# Patient Record
Sex: Female | Born: 1995 | Race: White | Hispanic: No | Marital: Single | Smoking: Never smoker
Health system: Southern US, Community
[De-identification: ages and names within clinical notes are randomized; demographics above are authoritative.]

## PROBLEM LIST (undated history)

## (undated) DIAGNOSIS — R87619 Unspecified abnormal cytological findings in specimens from cervix uteri: Secondary | ICD-10-CM

## (undated) DIAGNOSIS — Z9889 Other specified postprocedural states: Secondary | ICD-10-CM

## (undated) DIAGNOSIS — R112 Nausea with vomiting, unspecified: Secondary | ICD-10-CM

## (undated) HISTORY — PX: HAND SURGERY: SHX662

---

## 2017-03-23 ENCOUNTER — Ambulatory Visit: Payer: BLUE CROSS/BLUE SHIELD | Admitting: Family

## 2017-03-23 DIAGNOSIS — Z0289 Encounter for other administrative examinations: Secondary | ICD-10-CM

## 2017-06-15 DIAGNOSIS — R87619 Unspecified abnormal cytological findings in specimens from cervix uteri: Secondary | ICD-10-CM

## 2017-06-15 HISTORY — DX: Unspecified abnormal cytological findings in specimens from cervix uteri: R87.619

## 2017-09-02 ENCOUNTER — Emergency Department: Payer: BLUE CROSS/BLUE SHIELD | Admitting: Anesthesiology

## 2017-09-02 ENCOUNTER — Encounter
Admission: EM | Disposition: A | Discharge status: Home / Self Care | Payer: Self-pay | Source: Home / Self Care | Attending: Emergency Medicine

## 2017-09-02 ENCOUNTER — Emergency Department: Payer: BLUE CROSS/BLUE SHIELD

## 2017-09-02 ENCOUNTER — Ambulatory Visit
Admission: EM | Admit: 2017-09-02 | Discharge: 2017-09-02 | Disposition: A | Discharge status: Home / Self Care | Payer: BLUE CROSS/BLUE SHIELD | Attending: Family Medicine | Admitting: Family Medicine

## 2017-09-02 ENCOUNTER — Encounter: Payer: Self-pay | Admitting: Emergency Medicine

## 2017-09-02 ENCOUNTER — Encounter: Payer: Self-pay | Admitting: Gynecology

## 2017-09-02 ENCOUNTER — Emergency Department
Admission: EM | Admit: 2017-09-02 | Discharge: 2017-09-02 | Disposition: A | Discharge status: Home / Self Care | Payer: BLUE CROSS/BLUE SHIELD | Attending: Emergency Medicine | Admitting: Emergency Medicine

## 2017-09-02 ENCOUNTER — Other Ambulatory Visit: Payer: Self-pay

## 2017-09-02 DIAGNOSIS — Z8249 Family history of ischemic heart disease and other diseases of the circulatory system: Secondary | ICD-10-CM | POA: Insufficient documentation

## 2017-09-02 DIAGNOSIS — R1031 Right lower quadrant pain: Secondary | ICD-10-CM

## 2017-09-02 DIAGNOSIS — Z793 Long term (current) use of hormonal contraceptives: Secondary | ICD-10-CM | POA: Diagnosis not present

## 2017-09-02 DIAGNOSIS — N83209 Unspecified ovarian cyst, unspecified side: Secondary | ICD-10-CM

## 2017-09-02 DIAGNOSIS — Z9889 Other specified postprocedural states: Secondary | ICD-10-CM | POA: Diagnosis not present

## 2017-09-02 DIAGNOSIS — N8311 Corpus luteum cyst of right ovary: Secondary | ICD-10-CM | POA: Diagnosis not present

## 2017-09-02 DIAGNOSIS — R110 Nausea: Secondary | ICD-10-CM | POA: Diagnosis not present

## 2017-09-02 DIAGNOSIS — N83201 Unspecified ovarian cyst, right side: Secondary | ICD-10-CM | POA: Diagnosis present

## 2017-09-02 HISTORY — DX: Other specified postprocedural states: R11.2

## 2017-09-02 HISTORY — PX: LAPAROSCOPY: SHX197

## 2017-09-02 HISTORY — DX: Other specified postprocedural states: Z98.890

## 2017-09-02 HISTORY — DX: Unspecified abnormal cytological findings in specimens from cervix uteri: R87.619

## 2017-09-02 LAB — LIPASE, BLOOD: LIPASE: 26 U/L (ref 11–51)

## 2017-09-02 LAB — COMPREHENSIVE METABOLIC PANEL
ALBUMIN: 4.1 g/dL (ref 3.5–5.0)
ALT: 11 U/L — ABNORMAL LOW (ref 14–54)
AST: 20 U/L (ref 15–41)
Alkaline Phosphatase: 40 U/L (ref 38–126)
Anion gap: 10 (ref 5–15)
BUN: 10 mg/dL (ref 6–20)
CHLORIDE: 103 mmol/L (ref 101–111)
CO2: 23 mmol/L (ref 22–32)
Calcium: 8.8 mg/dL — ABNORMAL LOW (ref 8.9–10.3)
Creatinine, Ser: 0.65 mg/dL (ref 0.44–1.00)
GFR calc Af Amer: >60 mL/min (ref >60)
GFR calc non Af Amer: >60 mL/min (ref >60)
GLUCOSE: 100 mg/dL — AB (ref 65–99)
POTASSIUM: 3.9 mmol/L (ref 3.5–5.1)
SODIUM: 136 mmol/L (ref 135–145)
Total Bilirubin: 0.7 mg/dL (ref 0.3–1.2)
Total Protein: 7.6 g/dL (ref 6.5–8.1)

## 2017-09-02 LAB — URINALYSIS, COMPLETE (UACMP) WITH MICROSCOPIC
Bilirubin Urine: NEGATIVE
Bilirubin Urine: NEGATIVE
Glucose, UA: NEGATIVE mg/dL
Glucose, UA: NEGATIVE mg/dL
KETONES UR: NEGATIVE mg/dL
Ketones, ur: NEGATIVE mg/dL
Leukocytes, UA: NEGATIVE
Nitrite: NEGATIVE
Nitrite: NEGATIVE
PH: 7 (ref 5.0–8.0)
PROTEIN: NEGATIVE mg/dL
Protein, ur: NEGATIVE mg/dL
Specific Gravity, Urine: 1.02 (ref 1.005–1.030)
Specific Gravity, Urine: 1.004 — ABNORMAL LOW (ref 1.005–1.030)
pH: 7 (ref 5.0–8.0)

## 2017-09-02 LAB — CBC
HEMATOCRIT: 39.1 % (ref 35.0–47.0)
Hemoglobin: 13.4 g/dL (ref 12.0–16.0)
MCH: 30.8 pg (ref 26.0–34.0)
MCHC: 34.2 g/dL (ref 32.0–36.0)
MCV: 90.1 fL (ref 80.0–100.0)
Platelets: 300 10*3/uL (ref 150–440)
RBC: 4.34 MIL/uL (ref 3.80–5.20)
RDW: 12.4 % (ref 11.5–14.5)
WBC: 7 10*3/uL (ref 3.6–11.0)

## 2017-09-02 LAB — POCT PREGNANCY, URINE: PREG TEST UR: NEGATIVE

## 2017-09-02 SURGERY — LAPAROSCOPY, DIAGNOSTIC
Anesthesia: General | Wound class: Clean Contaminated

## 2017-09-02 MED ORDER — ACETAMINOPHEN 500 MG PO TABS
1000.0000 mg | ORAL_TABLET | Freq: Once | ORAL | Status: AC
  Administered 2017-09-02: 1000 mg via ORAL
  Filled 2017-09-02: qty 2

## 2017-09-02 MED ORDER — BUPIVACAINE HCL (PF) 0.5 % IJ SOLN
INTRAMUSCULAR | Status: AC
  Filled 2017-09-02: qty 30

## 2017-09-02 MED ORDER — MIDAZOLAM HCL 2 MG/2ML IJ SOLN
INTRAMUSCULAR | Status: AC
  Filled 2017-09-02: qty 2

## 2017-09-02 MED ORDER — HYDROMORPHONE HCL 1 MG/ML IJ SOLN
INTRAMUSCULAR | Status: DC | PRN
  Administered 2017-09-02: 1 mg via INTRAVENOUS

## 2017-09-02 MED ORDER — KETOROLAC TROMETHAMINE 30 MG/ML IJ SOLN
INTRAMUSCULAR | Status: AC
  Filled 2017-09-02: qty 1

## 2017-09-02 MED ORDER — OXYCODONE HCL 5 MG/5ML PO SOLN: 5.0000 mg | Freq: Once | ORAL | Status: DC | PRN

## 2017-09-02 MED ORDER — HYDROMORPHONE HCL 1 MG/ML IJ SOLN
INTRAMUSCULAR | Status: AC
  Filled 2017-09-02: qty 1

## 2017-09-02 MED ORDER — MORPHINE SULFATE (PF) 4 MG/ML IV SOLN
4.0000 mg | Freq: Once | INTRAVENOUS | Status: AC
  Administered 2017-09-02: 4 mg via INTRAVENOUS
  Filled 2017-09-02: qty 1

## 2017-09-02 MED ORDER — ONDANSETRON HCL 4 MG/2ML IJ SOLN
INTRAMUSCULAR | Status: AC
  Filled 2017-09-02: qty 2

## 2017-09-02 MED ORDER — FENTANYL CITRATE (PF) 100 MCG/2ML IJ SOLN
INTRAMUSCULAR | Status: AC
  Filled 2017-09-02: qty 2

## 2017-09-02 MED ORDER — LIDOCAINE HCL (CARDIAC) 20 MG/ML IV SOLN
INTRAVENOUS | Status: DC | PRN
  Administered 2017-09-02: 60 mg via INTRAVENOUS

## 2017-09-02 MED ORDER — SUCCINYLCHOLINE CHLORIDE 20 MG/ML IJ SOLN
INTRAMUSCULAR | Status: DC | PRN
  Administered 2017-09-02: 100 mg via INTRAVENOUS

## 2017-09-02 MED ORDER — MIDAZOLAM HCL 2 MG/2ML IJ SOLN
INTRAMUSCULAR | Status: DC | PRN
  Administered 2017-09-02: 2 mg via INTRAVENOUS

## 2017-09-02 MED ORDER — DEXAMETHASONE SODIUM PHOSPHATE 10 MG/ML IJ SOLN
INTRAMUSCULAR | Status: DC | PRN
  Administered 2017-09-02: 10 mg via INTRAVENOUS

## 2017-09-02 MED ORDER — ACETAMINOPHEN 10 MG/ML IV SOLN
INTRAVENOUS | Status: AC
  Filled 2017-09-02: qty 100

## 2017-09-02 MED ORDER — GABAPENTIN 300 MG PO CAPS
600.0000 mg | ORAL_CAPSULE | Freq: Once | ORAL | Status: AC
  Administered 2017-09-02: 600 mg via ORAL
  Filled 2017-09-02: qty 2

## 2017-09-02 MED ORDER — OXYCODONE HCL 5 MG PO TABS: 5.0000 mg | ORAL_TABLET | Freq: Once | ORAL | Status: DC | PRN

## 2017-09-02 MED ORDER — HYDROMORPHONE HCL 1 MG/ML IJ SOLN: 0.2500 mg | INTRAMUSCULAR | Status: DC | PRN

## 2017-09-02 MED ORDER — SCOPOLAMINE 1 MG/3DAYS TD PT72
1.0000 | MEDICATED_PATCH | TRANSDERMAL | Status: DC
  Administered 2017-09-02: 1.5 mg via TRANSDERMAL
  Filled 2017-09-02: qty 1

## 2017-09-02 MED ORDER — FENTANYL CITRATE (PF) 100 MCG/2ML IJ SOLN
50.0000 ug | INTRAMUSCULAR | Status: AC | PRN
  Administered 2017-09-02 (×4): 50 ug via INTRAVENOUS
  Filled 2017-09-02: qty 2

## 2017-09-02 MED ORDER — PROMETHAZINE HCL 25 MG/ML IJ SOLN
INTRAMUSCULAR | Status: AC
  Filled 2017-09-02: qty 1

## 2017-09-02 MED ORDER — IOPAMIDOL (ISOVUE-300) INJECTION 61%
30.0000 mL | Freq: Once | INTRAVENOUS | Status: AC | PRN
  Administered 2017-09-02: 30 mL via ORAL

## 2017-09-02 MED ORDER — OXYCODONE HCL 5 MG PO TABS: 5.0000 mg | ORAL_TABLET | ORAL | 0 refills | Status: AC | PRN

## 2017-09-02 MED ORDER — ONDANSETRON HCL 4 MG/2ML IJ SOLN
INTRAMUSCULAR | Status: DC | PRN
  Administered 2017-09-02: 4 mg via INTRAVENOUS

## 2017-09-02 MED ORDER — ONDANSETRON HCL 4 MG/2ML IJ SOLN
4.0000 mg | Freq: Once | INTRAMUSCULAR | Status: AC
  Administered 2017-09-02: 4 mg via INTRAVENOUS
  Filled 2017-09-02: qty 2

## 2017-09-02 MED ORDER — LACTATED RINGERS IV SOLN
INTRAVENOUS | Status: DC | PRN
  Administered 2017-09-02 (×2): via INTRAVENOUS

## 2017-09-02 MED ORDER — IBUPROFEN 800 MG PO TABS: 800.0000 mg | ORAL_TABLET | Freq: Four times a day (QID) | ORAL | 1 refills | Status: AC

## 2017-09-02 MED ORDER — PROPOFOL 10 MG/ML IV BOLUS
INTRAVENOUS | Status: DC | PRN
  Administered 2017-09-02: 120 mg via INTRAVENOUS

## 2017-09-02 MED ORDER — FENTANYL CITRATE (PF) 100 MCG/2ML IJ SOLN
25.0000 ug | INTRAMUSCULAR | Status: DC | PRN
  Administered 2017-09-02 (×2): 50 ug via INTRAVENOUS

## 2017-09-02 MED ORDER — FENTANYL CITRATE (PF) 250 MCG/5ML IJ SOLN
INTRAMUSCULAR | Status: AC
  Filled 2017-09-02: qty 5

## 2017-09-02 MED ORDER — SUCCINYLCHOLINE CHLORIDE 20 MG/ML IJ SOLN
INTRAMUSCULAR | Status: AC
  Filled 2017-09-02: qty 1

## 2017-09-02 MED ORDER — IOPAMIDOL (ISOVUE-300) INJECTION 61%
100.0000 mL | Freq: Once | INTRAVENOUS | Status: AC | PRN
  Administered 2017-09-02: 100 mL via INTRAVENOUS

## 2017-09-02 MED ORDER — DEXAMETHASONE SODIUM PHOSPHATE 10 MG/ML IJ SOLN
INTRAMUSCULAR | Status: AC
  Filled 2017-09-02: qty 1

## 2017-09-02 MED ORDER — PROMETHAZINE HCL 25 MG/ML IJ SOLN
6.2500 mg | INTRAMUSCULAR | Status: DC | PRN
  Administered 2017-09-02: 12.5 mg via INTRAVENOUS

## 2017-09-02 MED ORDER — LIDOCAINE HCL (PF) 2 % IJ SOLN
INTRAMUSCULAR | Status: AC
  Filled 2017-09-02: qty 10

## 2017-09-02 MED ORDER — PROPOFOL 10 MG/ML IV BOLUS
INTRAVENOUS | Status: AC
  Filled 2017-09-02: qty 20

## 2017-09-02 MED ORDER — KETOROLAC TROMETHAMINE 30 MG/ML IJ SOLN
INTRAMUSCULAR | Status: DC | PRN
  Administered 2017-09-02: 30 mg via INTRAVENOUS

## 2017-09-02 MED ORDER — ACETAMINOPHEN 10 MG/ML IV SOLN
1000.0000 mg | Freq: Once | INTRAVENOUS | Status: AC
  Administered 2017-09-02: 1000 mg via INTRAVENOUS

## 2017-09-02 SURGICAL SUPPLY — 46 items
APPLICATOR ARISTA FLEXITIP XL (MISCELLANEOUS) ×3 IMPLANT
BAG URINE DRAINAGE (UROLOGICAL SUPPLIES) ×3 IMPLANT
BLADE SURG SZ11 CARB STEEL (BLADE) ×3 IMPLANT
CANISTER SUCT 1200ML W/VALVE (MISCELLANEOUS) ×3 IMPLANT
CATH FOLEY 2WAY  5CC 16FR (CATHETERS) ×2
CATH URTH 16FR FL 2W BLN LF (CATHETERS) ×1 IMPLANT
CHLORAPREP W/TINT 26ML (MISCELLANEOUS) ×3 IMPLANT
DEFOGGER SCOPE WARMER CLEARIFY (MISCELLANEOUS) ×3 IMPLANT
DERMABOND ADVANCED (GAUZE/BANDAGES/DRESSINGS)
DERMABOND ADVANCED .7 DNX12 (GAUZE/BANDAGES/DRESSINGS) IMPLANT
DRAPE LEGGINS SURG 28X43 STRL (DRAPES) ×3 IMPLANT
DRAPE UNDER BUTTOCK W/FLU (DRAPES) ×3 IMPLANT
GLOVE PI ORTHOPRO 6.5 (GLOVE) ×2
GLOVE PI ORTHOPRO STRL 6.5 (GLOVE) ×1 IMPLANT
GLOVE SURG SYN 6.5 ES PF (GLOVE) ×6 IMPLANT
GOWN STRL REUS W/ TWL LRG LVL3 (GOWN DISPOSABLE) ×2 IMPLANT
GOWN STRL REUS W/TWL LRG LVL3 (GOWN DISPOSABLE) ×4
GRASPER SUT TROCAR 14GX15 (MISCELLANEOUS) ×3 IMPLANT
HEMOSTAT ARISTA ABSORB 1G (MISCELLANEOUS) ×3 IMPLANT
IRRIGATION STRYKERFLOW (MISCELLANEOUS) ×1 IMPLANT
IRRIGATOR STRYKERFLOW (MISCELLANEOUS) ×3
IV NS 1000ML (IV SOLUTION)
IV NS 1000ML BAXH (IV SOLUTION) IMPLANT
KIT PINK PAD W/HEAD ARE REST (MISCELLANEOUS) ×3
KIT PINK PAD W/HEAD ARM REST (MISCELLANEOUS) ×1 IMPLANT
KIT RM TURNOVER CYSTO AR (KITS) ×3 IMPLANT
L-HOOK LAP DISP 36CM (ELECTROSURGICAL) ×3
LABEL OR SOLS (LABEL) IMPLANT
LHOOK LAP DISP 36CM (ELECTROSURGICAL) ×1 IMPLANT
LIGASURE VESSEL 5MM BLUNT TIP (ELECTROSURGICAL) ×3 IMPLANT
MANIPULATOR UTERINE 4.5 ZUMI (MISCELLANEOUS) ×3 IMPLANT
NEEDLE HYPO 22GX1.5 SAFETY (NEEDLE) ×3 IMPLANT
NS IRRIG 500ML POUR BTL (IV SOLUTION) ×3 IMPLANT
PACK LAP CHOLECYSTECTOMY (MISCELLANEOUS) ×3 IMPLANT
PAD OB MATERNITY 4.3X12.25 (PERSONAL CARE ITEMS) ×3 IMPLANT
PAD PREP 24X41 OB/GYN DISP (PERSONAL CARE ITEMS) ×3 IMPLANT
PENCIL ELECTRO HAND CTR (MISCELLANEOUS) ×3 IMPLANT
POUCH SPECIMEN RETRIEVAL 10MM (ENDOMECHANICALS) IMPLANT
SCISSORS METZENBAUM CVD 33 (INSTRUMENTS) ×3 IMPLANT
SLEEVE ENDOPATH XCEL 5M (ENDOMECHANICALS) ×6 IMPLANT
SUT MNCRL AB 4-0 PS2 18 (SUTURE) ×3 IMPLANT
SUT VIC AB 2-0 UR6 27 (SUTURE) ×3 IMPLANT
SYR 10ML LL (SYRINGE) ×3 IMPLANT
TROCAR ENDO BLADELESS 11MM (ENDOMECHANICALS) ×3 IMPLANT
TROCAR XCEL NON-BLD 5MMX100MML (ENDOMECHANICALS) ×3 IMPLANT
TUBING INSUFFLATION (TUBING) ×3 IMPLANT

## 2017-09-02 NOTE — ED Provider Notes (Signed)
[facility] Emergency Department Provider Note ____________________________________________   First MD Initiated Contact with Patient 09/02/17 1116     (approximate)  I have reviewed the triage vital signs and the nursing notes.   HISTORY  Chief Complaint Abdominal Pain    HPI Erika Paul is a 22 y.o. female with no significant past medical history who presents with right lower quadrant abdominal pain gradual onset over the last 1 day, initially intermittent but now constant for the last several hours.  It is associated with nausea and with chills this morning, as well as with decreased appetite.  Patient denies diarrhea, fever, vomiting, dysuria, or any vaginal bleeding or discharge.  She states her last period was about 4 weeks ago and was normal.  No prior history of this pain.  Past Medical History:  Diagnosis Date  . Abnormal Pap smear of cervix 06/2017  . PONV (postoperative nausea and vomiting)     There are no active problems to display for this patient.   Past Surgical History:  Procedure Laterality Date  . HAND SURGERY Right     Prior to Admission medications   Medication Sig Start Date End Date Taking? Authorizing Provider  norgestimate-ethinyl estradiol (ORTHO-CYCLEN,SPRINTEC,PREVIFEM) 0.25-35 MG-MCG tablet Take 1 tablet by mouth daily.   Yes [provider]  ibuprofen (ADVIL,MOTRIN) 800 MG tablet Take 1 tablet (800 mg total) by mouth every 6 (six) hours. 09/02/17   Ward, Elenora Fender, MD  oxyCODONE (ROXICODONE) 5 MG immediate release tablet Take 1 tablet (5 mg total) by mouth every 4 (four) hours as needed. 09/02/17 09/02/18  Ward, Elenora Fender, MD    Allergies Patient has no known allergies.  Family History  Problem Relation Age of Onset  . Hypertension Father     Social History Social History   Tobacco Use  . Smoking status: Never Smoker  . Smokeless tobacco: Never Used  Substance Use Topics  . Alcohol use: No   Frequency: Never  . Drug use: No    Review of Systems  Constitutional: Positive for chills. Eyes: No redness. ENT: No sore throat. Cardiovascular: Denies chest pain. Respiratory: Denies shortness of breath. Gastrointestinal: Positive for nausea. Genitourinary: Negative for dysuria.  Musculoskeletal: Negative for back pain. Skin: Negative for rash. Neurological: Negative for headache.   ____________________________________________   PHYSICAL EXAM:  VITAL SIGNS: ED Triage Vitals  Enc Vitals Group     BP 09/02/17 0935 132/77     Pulse Rate 09/02/17 0935 89     Resp 09/02/17 0935 18     Temp 09/02/17 0935 98.8 F (37.1 C)     Temp Source 09/02/17 0935 Oral     SpO2 09/02/17 0935 100 %     Weight 09/02/17 0936 140 lb (63.5 kg)     Height 09/02/17 0936 5\' 4"  (1.626 m)     Head Circumference --      Peak Flow --      Pain Score 09/02/17 0935 9     Pain Loc --      Pain Edu? --      Excl. in GC? --     Constitutional: Alert and oriented. Well appearing and in no acute distress. Eyes: Conjunctivae are normal.  Head: Atraumatic. Nose: No congestion/rhinnorhea. Mouth/Throat: Mucous membranes are moist.   Neck: Normal range of motion.  Cardiovascular:  Good peripheral circulation. Respiratory: Normal respiratory effort. . Gastrointestinal: Soft with moderate right lower quadrant and right suprapubic tenderness. No distention.  Genitourinary: No flank  tenderness. Musculoskeletal: No lower extremity edema.  Extremities warm and well perfused.  Neurologic:  Normal speech and language. No gross focal neurologic deficits are appreciated.  Skin:  Skin is warm and dry. No rash noted. Psychiatric: Mood and affect are normal. Speech and behavior are normal.  ____________________________________________   LABS (all labs ordered are listed, but only abnormal results are displayed)  Labs Reviewed  COMPREHENSIVE METABOLIC PANEL - Abnormal; Notable for the following components:        Result Value   Glucose, Bld 100 (*)    Calcium 8.8 (*)    ALT 11 (*)    All other components within normal limits  URINALYSIS, COMPLETE (UACMP) WITH MICROSCOPIC - Abnormal; Notable for the following components:   Color, Urine STRAW (*)    APPearance CLEAR (*)    Specific Gravity, Urine 1.004 (*)    Hgb urine dipstick SMALL (*)    Bacteria, UA RARE (*)    Squamous Epithelial / LPF 0-5 (*)    All other components within normal limits  LIPASE, BLOOD  CBC  POC URINE PREG, ED  POCT PREGNANCY, URINE  SURGICAL PATHOLOGY   ____________________________________________  EKG   ____________________________________________  RADIOLOGY  CT abdomen: Normal appendix.  Right ovarian cyst. US pelvis: Large right ovarian cyst with no evidence of torsion ____________________________________________   PROCEDURES  Procedure(s) performed: No    Critical Care performed: No ____________________________________________   INITIAL IMPRESSION / ASSESSMENT AND PLAN / ED COURSE  Pertinent labs & imaging results that were available during my care of the patient were reviewed by me and considered in my medical decision making (see chart for details).  22 year old female with no significant past medical history presents with 1 day of right lower quadrant pain which is been constant over the last several hours and associated with chills and nausea.  Past medical records reviewed in Epic and are noncontributory.  On exam, the patient is well-appearing, the vital signs are normal, there is moderate tenderness in the right lower quadrant right suprapubic region.  Presentation is most concerning for acute appendicitis, but differential also includes mesenteric adenitis or other benign cause, versus gynecologic cause such as ovarian cyst rupture, endometriosis or fibroids.  Based on discussion with patient we will defer pelvic exam at this time and obtain a CT to rule out appendicitis.  If negative  will perform a pelvic exam and ultrasound.  ----------------------------------------- 1:33 PM on 09/02/2017 -----------------------------------------  CT shows no evidence of acute appendicitis.  There is a relatively large right ovarian cyst.  Will obtain a pelvic ultrasound to further characterize it.  ----------------------------------------- 4:19 PM on 09/02/2017 -----------------------------------------  Ultrasound confirms large and minimally complex right ovarian cyst.  I consulted Dr. Elesa Massed from OB/GYN to evaluate the patient.  ----------------------------------------- 9:08 PM on 09/02/2017 -----------------------------------------  Dr. Elesa Massed recommended to take the patient to the OR.  Patient was admitted.     ____________________________________________   FINAL CLINICAL IMPRESSION(S) / ED DIAGNOSES  Final diagnoses:  Ovarian cyst      NEW MEDICATIONS STARTED DURING THIS VISIT:  Current Discharge Medication List    START taking these medications   Details  ibuprofen (ADVIL,MOTRIN) 800 MG tablet Take 1 tablet (800 mg total) by mouth every 6 (six) hours. Qty: 45 tablet, Refills: 1    oxyCODONE (ROXICODONE) 5 MG immediate release tablet Take 1 tablet (5 mg total) by mouth every 4 (four) hours as needed. Qty: 11 tablet, Refills: 0  Note:  This document was prepared using Dragon voice recognition software and may include unintentional dictation errors.    Dionne BucySiadecki, Kaelan Emami, MD 09/02/17 2108

## 2017-09-02 NOTE — Anesthesia Post-op Follow-up Note (Signed)
Anesthesia QCDR form completed.        

## 2017-09-02 NOTE — H&P (Signed)
Consult History and Physical   SERVICE: Gynecology   Patient Name: Erika Paul Patient MRN:   [id number]  CC: sudden onset RLQ pain  HPI: Erika Paul is a 22 y.o. G0 with sudden onset RLQ pain.  Was worked up in the ED with CT and ultrasound, found to have a 9cm right septated, cystic adnexal mass.   Her pain is currently tolerable on morphine, but she cannot lay straight.   Review of Systems: positives in bold GEN:   fevers, chills, weight changes, appetite changes, fatigue, night sweats HEENT:  HA, vision changes, hearing loss, congestion, rhinorrhea, sinus pressure, dysphagia CV:   CP, palpitations PULM:  SOB, cough GI:  abd pain, N/V/D/C GU:  dysuria, urgency, frequency MSK:  arthralgias, myalgias, back pain, swelling SKIN:  rashes, color changes, pallor NEURO:  numbness, weakness, tingling, seizures, dizziness, tremors PSYCH:  depression, anxiety, behavioral problems, confusion  HEME/LYMPH:  easy bruising or bleeding ENDO:  heat/cold intolerance  Past Obstetrical History: OB History    G0      Past Gynecologic History: Patient's last menstrual period was 08/09/2017. Menstrual frequency Q28d, on OCP but regular not on hormones.  Had skyla in past did not like "abnormal pap' told to repeat next year.  Past Medical History: Past Medical History:  Diagnosis Date  . Abnormal Pap smear of cervix 06/2017    Past Surgical History:   Past Surgical History:  Procedure Laterality Date  . HAND SURGERY Right     Family History:  family history includes Hypertension in her father.  Social History:  Social History   Socioeconomic History  . Marital status: Single    Spouse name: Not on file  . Number of children: Not on file  . Years of education: Not on file  . Highest education level: Not on file  Social Needs  . Financial resource strain: Not on file  . Food insecurity - worry: Not on file  . Food insecurity - inability: Not on file  . Transportation  needs - medical: Not on file  . Transportation needs - non-medical: Not on file  Occupational History  . Not on file  Tobacco Use  . Smoking status: Never Smoker  . Smokeless tobacco: Never Used  Substance and Sexual Activity  . Alcohol use: No    Frequency: Never  . Drug use: No  . Sexual activity: Not on file  Other Topics Concern  . Not on file  Social History Narrative  . Not on file    Home Medications:  Medications reconciled in EPIC  No current facility-administered medications on file prior to encounter.    Current Outpatient Medications on File Prior to Encounter  Medication Sig Dispense Refill  . norgestimate-ethinyl estradiol (ORTHO-CYCLEN,SPRINTEC,PREVIFEM) 0.25-35 MG-MCG tablet Take 1 tablet by mouth daily.      Allergies:  No Known Allergies  Physical Exam:  Temp:  [98.8 F (37.1 C)-98.9 F (37.2 C)] 98.8 F (37.1 C) (01/19 0935) Pulse Rate:  [58-92] 58 (01/19 1530) Resp:  [16-18] 18 (01/19 0935) BP: (102-134)/(67-90) 102/78 (01/19 1530) SpO2:  [98 %-100 %] 100 % (01/19 1530) Weight:  [63.5 kg (140 lb)] 63.5 kg (140 lb) (01/19 0936)   General Appearance:  Well developed, well nourished, no acute distress, alert and oriented, cooperative and appears stated age HEENT:  Normocephalic atraumatic, extraocular movements intact, moist mucous membranes, neck supple with midline trachea and thyroid without masses Cardiovascular:  Normal S1/S2, regular rate and rhythm, no murmurs, 2+ distal pulses Pulmonary:  clear to auscultation, no wheezes, rales or rhonchi, symmetric air entry, good air exchange Abdomen:  Bowel sounds present, soft, nontender, nondistended, no abnormal masses or organomegaly, no epigastric pain Back: inspection of back is normal Extremities:  extremities normal, no tenderness, atraumatic, no cyanosis or edema Skin:  normal coloration and turgor, no rashes, no suspicious skin lesions noted  Neurologic:  Cranial nerves 2-12 grossly intact,  grossly equal strength and muscle tone, normal speech, no focal findings or movement disorder noted. Psychiatric:  Normal mood and affect, appropriate, no AH/VH Pelvic:  deferred  Labs/Studies:   Results for orders placed or performed during the hospital encounter of 09/02/17 (from the past 24 hour(s))  Urinalysis, Complete w Microscopic     Status: Abnormal   Collection Time: 09/02/17  9:37 AM  Result Value Ref Range   Color, Urine STRAW (A) YELLOW   APPearance CLEAR (A) CLEAR   Specific Gravity, Urine 1.004 (L) 1.005 - 1.030   pH 7.0 5.0 - 8.0   Glucose, UA NEGATIVE NEGATIVE mg/dL   Hgb urine dipstick SMALL (A) NEGATIVE   Bilirubin Urine NEGATIVE NEGATIVE   Ketones, ur NEGATIVE NEGATIVE mg/dL   Protein, ur NEGATIVE NEGATIVE mg/dL   Nitrite NEGATIVE NEGATIVE   Leukocytes, UA NEGATIVE NEGATIVE   RBC / HPF 0-5 0 - 5 RBC/hpf   WBC, UA 0-5 0 - 5 WBC/hpf   Bacteria, UA RARE (A) NONE SEEN   Squamous Epithelial / LPF 0-5 (A) NONE SEEN  Lipase, blood     Status: None   Collection Time: 09/02/17  9:41 AM  Result Value Ref Range   Lipase 26 11 - 51 U/L  Comprehensive metabolic panel     Status: Abnormal   Collection Time: 09/02/17  9:41 AM  Result Value Ref Range   Sodium 136 135 - 145 mmol/L   Potassium 3.9 3.5 - 5.1 mmol/L   Chloride 103 101 - 111 mmol/L   CO2 23 22 - 32 mmol/L   Glucose, Bld 100 (H) 65 - 99 mg/dL   BUN 10 6 - 20 mg/dL   Creatinine, Ser 1.61 0.44 - 1.00 mg/dL   Calcium 8.8 (L) 8.9 - 10.3 mg/dL   Total Protein 7.6 6.5 - 8.1 g/dL   Albumin 4.1 3.5 - 5.0 g/dL   AST 20 15 - 41 U/L   ALT 11 (L) 14 - 54 U/L   Alkaline Phosphatase 40 38 - 126 U/L   Total Bilirubin 0.7 0.3 - 1.2 mg/dL   GFR calc non Af Amer >60 >60 mL/min   GFR calc Af Amer >60 >60 mL/min   Anion gap 10 5 - 15  CBC     Status: None   Collection Time: 09/02/17  9:41 AM  Result Value Ref Range   WBC 7.0 3.6 - 11.0 K/uL   RBC 4.34 3.80 - 5.20 MIL/uL   Hemoglobin 13.4 12.0 - 16.0 g/dL   HCT 09.6  04.5 - 40.9 %   MCV 90.1 80.0 - 100.0 fL   MCH 30.8 26.0 - 34.0 pg   MCHC 34.2 32.0 - 36.0 g/dL   RDW 81.1 91.4 - 78.2 %   Platelets 300 150 - 440 K/uL  Pregnancy, urine POC     Status: None   Collection Time: 09/02/17  9:49 AM  Result Value Ref Range   Preg Test, Ur NEGATIVE NEGATIVE     TVUS:   Other Imaging: US Pelvis Transvanginal Non-ob (tv Only)  Result Date: 09/02/2017 CLINICAL DATA:  Right ovarian cyst measuring 7 cm by CT. EXAM: TRANSABDOMINAL AND TRANSVAGINAL ULTRASOUND OF PELVIS DOPPLER ULTRASOUND OF OVARIES TECHNIQUE: Both transabdominal and transvaginal ultrasound examinations of the pelvis were performed. Transabdominal technique was performed for global imaging of the pelvis including uterus, ovaries, adnexal regions, and pelvic cul-de-sac. It was necessary to proceed with endovaginal exam following the transabdominal exam to visualize the uterus and adnexae in better detail. Color and duplex Doppler ultrasound was utilized to evaluate blood flow to the ovaries. COMPARISON:  09/02/2017 FINDINGS: Uterus Measurements: 8.2 x 3.1 x 3.8 cm. No fibroids or other mass visualized. Endometrium Thickness: 7 mm.  No focal abnormality visualized. Right ovary Measurements: 8.5 x 5.4 x 7.1 cm. Right ovary contains a large minimally complex septated cyst measuring 7.8 x 4.3 x 7.1 cm. This correlates with the CT finding. Left ovary Measurements: 3.3 x 1.9 x 2.3 cm. Normal appearance/no adnexal mass. Pulsed Doppler evaluation of both ovaries demonstrates normal low-resistance arterial and venous waveforms. Other findings Small amount of pelvic free fluid IMPRESSION: 7.8 cm right ovarian minimally complex septated cyst correlates with the CT finding. Since these may be difficult to assess completely with US, further evaluation of simple-appearing cysts >7 cm with MRI or surgical evaluation is recommended according to the Society of Radiologists in Ultrasound 2010 Consensus Conference Statement (D  Lenis NoonLevine et al. Management of Asymptomatic Ovarian and other Adnexal Cysts Imaged at US: Society of Radiologists in Ultrasound Consensus Conference Statement 2010. Radiology 256 (Sept 2010): 943-954.). No other acute pelvic finding or evidence of torsion by ultrasound. Electronically Signed   By: Judie PetitM.  Shick M.D.   On: 09/02/2017 14:42    Ct Abdomen Pelvis W Contrast  Result Date: 09/02/2017 CLINICAL DATA:  Right lower abdominal pain for 2 days. Nausea and vomiting EXAM: CT ABDOMEN AND PELVIS WITH CONTRAST TECHNIQUE: Multidetector CT imaging of the abdomen and pelvis was performed using the standard protocol following bolus administration of intravenous contrast. Oral contrast was also administered. CONTRAST:  100mL ISOVUE-300 IOPAMIDOL (ISOVUE-300) INJECTION 61% COMPARISON:  None. FINDINGS: Lower chest: Lung bases are clear. Hepatobiliary: No focal liver lesions are apparent. Gallbladder wall is not appreciably thickened. There is no biliary duct dilatation. Pancreas: There is no pancreatic mass or inflammatory focus. Spleen: No splenic lesions are evident. Adrenals/Urinary Tract: Adrenals bilaterally appear unremarkable. Kidneys bilaterally show no evident mass or hydronephrosis on either side. There is no renal or ureteral calculus on either side. Urinary bladder is midline with wall thickness within normal limits. Stomach/Bowel: There is fairly diffuse stool throughout the colon. There is no appreciable bowel wall or mesenteric thickening. No evident bowel obstruction. No free air or portal venous air. Vascular/Lymphatic: There is no abdominal aortic aneurysm. No vascular lesions are evident on this study. There is no appreciable adenopathy in the abdomen or pelvis. Reproductive: Uterus is anteverted. There is a cystic structure arising in the right adnexa measuring 7.2 x 6.2 x 5.3 cm. There is minimal apparent debris within this cystic structure. This cystic right adnexal mass is mildly deviating the ureter  toward the left. No other pelvic masses evident. There is no appreciable free pelvic fluid. Other: Appendix appears normal. There is no abscess or ascites in the abdomen or pelvis. Musculoskeletal: There are no blastic or lytic bone lesions. No intramuscular or abdominal wall lesions evident. IMPRESSION: 1. Cystic right ovarian/adnexal mass measuring 7.2 x 6.2 x 5.3 cm. This mass appears to contain mild debris. It mildly deviates the uterus toward the left. Suspect ovarian cyst  versus cystadenoma. Pelvic ultrasound may be helpful for more precise assessment. 2.  No ascites.  No free pelvic fluid. 3.  Appendix appears normal. 4. Diffuse stool throughout colon. No bowel wall thickening or bowel obstruction. No abscess in the abdomen or pelvis. 5.  No renal or ureteral calculus.  No hydronephrosis. Electronically Signed   By: Bretta Bang III M.D.   On: 09/02/2017 12:24      Assessment / Plan:   Erika Paul is a 22 y.o. G0 with large adnexal mass and sudden onset pain 1. Although doppler demonstrates blood flow, this could be intermittent and with her continued pain, cannot rule out torsion.  Either way an 8cm mass with pain should have surgical exploration.  2. Consented for dx laparoscopy and ovarian cystectomy vs oophorectomy and other procedures as necessary.  To OR when ready.     Thank you for the opportunity to be involved with this patient's care.  ----- Ranae Plumber, MD Attending Obstetrician and Gynecologist [facility], Department of OB/GYN [facility]

## 2017-09-02 NOTE — ED Triage Notes (Signed)
R lower abd pain x 2 day. Intermittent prior to today, usually after urination. Consistent today.

## 2017-09-02 NOTE — ED Notes (Signed)
Pharmacy called to send scolopamine patch

## 2017-09-02 NOTE — Discharge Instructions (Signed)
Recommend patient go to Emergency Department for further evaluation °

## 2017-09-02 NOTE — Discharge Instructions (Signed)
Discharge instructions:  Call office if you have any of the following: fever >101 F, chills, excessive vaginal bleeding, incision drainage or problems, leg pain or redness, or any other concerns.   No driving until you are sure you can slam on the brakes in an emergency.  And never on narcotics!  You may feel some pain in your upper right abdomen/rib and right shoulder.  This is from the gas in the abdomen for surgery. This will subside over time, please be patient!  Take 600mg  Ibuprofen and 1000mg  Tylenol around the clock, every 6 hours for at least the first 3-5 days.  After this you can take as needed.  This will help decrease inflammation and promote healing.  The narcotics you'll take just as needed, as they just trick your brain into thinking its not in pain.    Please don't limit yourself in terms of routine activity.  You will be able to do most things, although they may take longer to do or be a little painful.  You can do it!  Don't be a hero, but don't be a wimp either!

## 2017-09-02 NOTE — ED Provider Notes (Signed)
[facility]    CSN: [id number] Arrival date & time: 09/02/17  7829     History   Chief Complaint Chief Complaint  Patient presents with  . Abdominal Pain    HPI Erika Paul is a 22 y.o. female.    The history is provided by the patient.  Abdominal Pain  Pain location:  RLQ Pain quality: aching and gnawing   Pain radiates to:  Suprapubic region Pain severity:  Severe Onset quality:  Sudden Duration:  2 days Timing:  Constant Progression:  Worsening Chronicity:  New Context: not alcohol use, not awakening from sleep, not diet changes, not eating, not laxative use, not medication withdrawal, not previous surgeries, not recent illness, not recent sexual activity, not recent travel, not retching, not sick contacts, not suspicious food intake and not trauma   Relieved by:  None tried Ineffective treatments:  None tried Associated symptoms: nausea   Associated symptoms: no anorexia, no belching, no chest pain, no chills, no constipation, no cough, no diarrhea, no dysuria, no fatigue, no fever, no flatus, no hematemesis, no hematochezia, no hematuria, no melena, no shortness of breath, no sore throat, no vaginal bleeding, no vaginal discharge and no vomiting   Risk factors: no alcohol abuse, no aspirin use, not elderly, has not had multiple surgeries, no NSAID use, not obese and no recent hospitalization     Past Medical History:  Diagnosis Date  . Abnormal Pap smear of cervix 06/2017    There are no active problems to display for this patient.   Past Surgical History:  Procedure Laterality Date  . HAND SURGERY Right     OB History    No data available       Home Medications    Prior to Admission medications   Medication Sig Start Date End Date Taking? Authorizing Provider  norgestimate-ethinyl estradiol (ORTHO-CYCLEN,SPRINTEC,PREVIFEM) 0.25-35 MG-MCG tablet Take 1 tablet by mouth daily.   Yes [provider]    Family History Family  History  Problem Relation Age of Onset  . Hypertension Father     Social History Social History   Tobacco Use  . Smoking status: Never Smoker  . Smokeless tobacco: Never Used  Substance Use Topics  . Alcohol use: No    Frequency: Never  . Drug use: No     Allergies   Patient has no allergy information on record.   Review of Systems Review of Systems  Constitutional: Negative for chills, fatigue and fever.  HENT: Negative for sore throat.   Respiratory: Negative for cough and shortness of breath.   Cardiovascular: Negative for chest pain.  Gastrointestinal: Positive for abdominal pain and nausea. Negative for anorexia, constipation, diarrhea, flatus, hematemesis, hematochezia, melena and vomiting.  Genitourinary: Negative for dysuria, hematuria, vaginal bleeding and vaginal discharge.     Physical Exam Triage Vital Signs ED Triage Vitals  Enc Vitals Group     BP 09/02/17 0834 109/90     Pulse Rate 09/02/17 0834 92     Resp 09/02/17 0834 16     Temp 09/02/17 0834 98.9 F (37.2 C)     Temp Source 09/02/17 0834 Oral     SpO2 09/02/17 0834 100 %     Weight 09/02/17 0829 140 lb (63.5 kg)     Height 09/02/17 0829 5\' 4"  (1.626 m)     Head Circumference --      Peak Flow --      Pain Score 09/02/17 0829 9  Pain Loc --      Pain Edu? --      Excl. in GC? --    No data found.  Updated Vital Signs BP 109/90 (BP Location: Left Arm)   Pulse 92   Temp 98.9 F (37.2 C) (Oral)   Resp 16   Ht 5\' 4"  (1.626 m)   Wt 140 lb (63.5 kg)   LMP 08/09/2017   SpO2 100%   BMI 24.03 kg/m    Visual Acuity Right Eye Distance:   Left Eye Distance:   Bilateral Distance:    Right Eye Near:   Left Eye Near:    Bilateral Near:     Physical Exam  Constitutional: She appears well-developed and well-nourished.  Cardiovascular: Normal rate.  Pulmonary/Chest: Effort normal and breath sounds normal. No stridor. No respiratory distress. She has no wheezes.  Abdominal: Soft.  Bowel sounds are normal. She exhibits no distension and no mass. There is tenderness (at McBurney's point; positive obturator sign, psoas sign, heel tap sign ). There is no rebound and no guarding. No hernia.  Nursing note and vitals reviewed.    UC Treatments / Results  Labs (all labs ordered are listed, but only abnormal results are displayed) Labs Reviewed  URINALYSIS, COMPLETE (UACMP) WITH MICROSCOPIC - Abnormal; Notable for the following components:      Result Value   APPearance CLOUDY (*)    Hgb urine dipstick TRACE (*)    Leukocytes, UA TRACE (*)    Squamous Epithelial / LPF TOO NUMEROUS TO COUNT (*)    Bacteria, UA MANY (*)    All other components within normal limits    EKG  EKG Interpretation None       Radiology No results found.  Procedures Procedures (including critical care time)  Medications Ordered in UC Medications - No data to display   Initial Impression / Assessment and Plan / UC Course  I have reviewed the triage vital signs and the nursing notes.  Pertinent labs & imaging results that were available during my care of the patient were reviewed by me and considered in my medical decision making (see chart for details).       Final Clinical Impressions(s) / UC Diagnoses   Final diagnoses:  Abdominal pain, acute, right lower quadrant    ED Discharge Orders    None     1. Lab results and possible diagnoses reviewed with patient; based on clinical signs suspect possible appendicitis;   recommend patient go to Emergency Department for further evaluation and management. [facility]   Controlled Substance Prescriptions [place] Controlled Substance Registry consulted? Not Applicable  Payton Mccallumonty, Charrie Mcconnon, MD 09/02/17 [phone]

## 2017-09-02 NOTE — ED Notes (Addendum)
Surgeon requested that scop patch be placed and tylenol and gabapentin be given with a sip of water at 6pm prior to going to surgery

## 2017-09-02 NOTE — Anesthesia Procedure Notes (Signed)
Procedure Name: Intubation Date/Time: 09/02/2017 6:38 PM Performed by: Clinton Sawyer, CRNA Pre-anesthesia Checklist: Patient identified, Emergency Drugs available, Suction available, Patient being monitored and Timeout performed Patient Re-evaluated:Patient Re-evaluated prior to induction Oxygen Delivery Method: Circle system utilized Preoxygenation: Pre-oxygenation with 100% oxygen Induction Type: IV induction, Cricoid Pressure applied and Rapid sequence Laryngoscope Size: Mac and 4 Grade View: Grade I Tube type: Oral Tube size: 7.0 mm Number of attempts: 1 Placement Confirmation: ETT inserted through vocal cords under direct vision,  positive ETCO2,  CO2 detector and breath sounds checked- equal and bilateral Secured at: 21 cm Tube secured with: Tape Dental Injury: Teeth and Oropharynx as per pre-operative assessment

## 2017-09-02 NOTE — Op Note (Signed)
Erika Paul Whiten PROCEDURE DATE: 09/02/2017  PATIENT:  Erika Paul  22 y.o. female  PRE-OPERATIVE DIAGNOSIS:  right adnexal mass  POST-OPERATIVE DIAGNOSIS:  Same - right ruptured hemorrhagic ovarian cyst, stage 1 endometriosis.  PROCEDURE:  Procedure(s): LAPAROSCOPY DIAGNOSTIC (N/A) Right partial oophorectomy Right ovarian cystectomy  SURGEON:  Surgeon(s) and Role:    * Harmoni Lucus, Elenora Fenderhelsea C, MD - Primary  ANESTHESIA:  General via ET  I/O  Total I/O In: 1600 [I.V.:1600] Out: 170 [Urine:150; Blood:20]  FINDINGS:  Hematometrium, ~50cc.  Small uterus, anteverted with peritoneum extended on the anterior uterus near the fundus keeping it in an anteverted position at all times, normal appearing left fallopian tube and ovary. Enlarged right ovary, ruptured with blood fluid draining from the lateral wall.  Clear straw coloured fluid also draining from this site.  Normal right fallopian tube. Endometriosis implants - both blue hemosiderin and brown spots with large clusters in the posterior cul de sac with a peritoneal band from midline to the left pelvic side wall.  Large stool burden in sigmoid and cecum.    Normal upper abdomen.  SPECIMEN: portion of right ovary and ovarian cyst wall.  COMPLICATIONS: none apparent  DISPOSITION: vital signs stable to PACU   Indication for Surgery: 22 y.o. G0 who presented to the ED with acute onset RLQ pain and found to have an 8-9cm adnexal mass with septation.    Risks of surgery were discussed with the patient including but not limited to: bleeding which may require transfusion or reoperation; infection which may require antibiotics; injury to bowel, bladder, ureters or other surrounding organs; need for additional procedures including laparotomy, blood clot, incisional problems and other postoperative/anesthesia complications. Written informed consent was obtained.      PROCEDURE IN DETAIL:  The patient had sequential compression devices applied to her  lower extremities while in the preoperative area.  She was then taken to the operating room where general anesthesia was administered and was found to be adequate.  She was placed in the dorsal lithotomy position, and was prepped and draped in a sterile manner.  A Foley catheter was inserted into her bladder and attached to constant drainage and a uterine manipulator was then advanced into the uterus .  After a surgical timeout was performed, attention was turned to the abdomen where an umbilical incision was made with the scalpel. A 5mm trochar was inserted in the umbilical incision using a visiport method. Opening pressure was 6mmHg, and the abdomen was insufflated to 15mmHg carbon dioxide gas and adequate pneumoperitoneum was obtained.  A survey of the patient's pelvis and abdomen revealed the findings as mentioned above. Two 5mm ports were inserted in the lower left and right quadrants under visualization.    The hemoperitoneum was evacuated and irrigated, and the site of the ovarian rupture was located, and the large cyst fluid was also evacuated.  The deflated cyst had a marked line where normal ovarian tissue was noted.  The Ligasure was then used to transect the cyst wall and the ovary at this site.  The ovarian tissue was removed in parts through the trochars, and the remaining ovarian tissue was observed for hemostasis.  The ligasure was used for cautery at a few sites along the edge of the ovary, and Arista hemostatic agent was used within the stroma of the ovary for hemostasis.  The pressure was dropped to 7mmHg and hemostasis was observed.  The operative site was surveyed, and it was found to be hemostatic.  No intraoperative  injury to surrounding organs was noted.  The abdomen was desufflated and all instruments were then removed from the patient's abdomen. The uterine manipulator was removed without complications.  All skin incisions were closed with 4-0 monocryl and covered with surgical glue.  The patient tolerated the procedures well.  All instruments, needles, and sponge counts were correct x 2. The patient was taken to the recovery room in stable condition.   ---- Ranae Plumber, MD Attending Obstetrician and Gynecologist [facility]

## 2017-09-02 NOTE — Transfer of Care (Signed)
Immediate Anesthesia Transfer of Care Note  Patient: Erika Paul  Procedure(s) Performed: LAPAROSCOPY DIAGNOSTIC (N/A )  Patient Location: PACU  Anesthesia Type:General  Level of Consciousness: awake, alert  and oriented  Airway & Oxygen Therapy: Patient Spontanous Breathing and Patient connected to nasal cannula oxygen  Post-op Assessment: Report given to RN and Post -op Vital signs reviewed and stable  Post vital signs: Reviewed and stable  Last Vitals:  Vitals:   09/02/17 1630 09/02/17 2007  BP: 113/71   Pulse:  (!) 113  Resp:  19  Temp:  (!) 36.3 C  SpO2:  100%    Last Pain:  Vitals:   09/02/17 2007  TempSrc:   PainSc: 7          Complications: No apparent anesthesia complications

## 2017-09-02 NOTE — ED Triage Notes (Signed)
Patient c/o right lower abdominal pain x 2 days ago. Per patient pain worse this morning after urination.

## 2017-09-02 NOTE — Anesthesia Postprocedure Evaluation (Signed)
Anesthesia Post Note  Patient: Erika Paul  Procedure(s) Performed: LAPAROSCOPY DIAGNOSTIC (N/A )  Patient location during evaluation: PACU Anesthesia Type: General Level of consciousness: awake and alert Pain management: pain level controlled Vital Signs Assessment: post-procedure vital signs reviewed and stable Respiratory status: spontaneous breathing, nonlabored ventilation, respiratory function stable and patient connected to nasal cannula oxygen Cardiovascular status: blood pressure returned to baseline and stable Postop Assessment: no apparent nausea or vomiting Anesthetic complications: no     Last Vitals:  Vitals:   09/02/17 2050 09/02/17 2105  BP:  122/74  Pulse: 77 (!) 110  Resp: 12 16  Temp: 36.6 C   SpO2: 100% 100%    Last Pain:  Vitals:   09/02/17 2105  TempSrc:   PainSc: 2                  Cleda MccreedyJoseph K Kani Jobson

## 2017-09-02 NOTE — ED Notes (Signed)
Report given to OR by Paulino RilyAshley RN and pt transported to OR by [employer] RN

## 2017-09-02 NOTE — Anesthesia Preprocedure Evaluation (Signed)
Anesthesia Evaluation  Patient identified by MRN, date of birth, ID band Patient awake    Reviewed: Allergy & Precautions, H&P , NPO status , Patient's Chart, lab work & pertinent test results  History of Anesthesia Complications Negative for: history of anesthetic complications  Airway Mallampati: I  TM Distance: >3 FB Neck ROM: full    Dental  (+) Chipped   Pulmonary neg pulmonary ROS, neg shortness of breath,           Cardiovascular Exercise Tolerance: Good (-) angina(-) Past MI and (-) DOE negative cardio ROS       Neuro/Psych negative neurological ROS  negative psych ROS   GI/Hepatic negative GI ROS, Neg liver ROS, neg GERD  ,  Endo/Other  negative endocrine ROS  Renal/GU      Musculoskeletal   Abdominal   Peds  Hematology negative hematology ROS (+)   Anesthesia Other Findings Past Medical History: 06/2017: Abnormal Pap smear of cervix  Past Surgical History: No date: HAND SURGERY; Right  BMI    Body Mass Index:  24.03 kg/m      Reproductive/Obstetrics negative OB ROS                             Anesthesia Physical Anesthesia Plan  ASA: I  Anesthesia Plan: General ETT   Post-op Pain Management:    Induction: Intravenous  PONV Risk Score and Plan: Ondansetron, Dexamethasone, Midazolam and Scopolamine patch - Pre-op  Airway Management Planned: Oral ETT  Additional Equipment:   Intra-op Plan:   Post-operative Plan: Extubation in OR  Informed Consent: I have reviewed the patients History and Physical, chart, labs and discussed the procedure including the risks, benefits and alternatives for the proposed anesthesia with the patient or authorized representative who has indicated his/her understanding and acceptance.   Dental Advisory Given  Plan Discussed with: Anesthesiologist, CRNA and Surgeon  Anesthesia Plan Comments: (Patient consented for risks of  anesthesia including but not limited to:  - adverse reactions to medications - damage to teeth, lips or other oral mucosa - sore throat or hoarseness - Damage to heart, brain, lungs or loss of life  Patient voiced understanding.)        Anesthesia Quick Evaluation

## 2017-09-04 ENCOUNTER — Encounter: Payer: Self-pay | Admitting: Obstetrics & Gynecology

## 2017-09-05 LAB — SURGICAL PATHOLOGY

## 2020-08-18 ENCOUNTER — Other Ambulatory Visit: Payer: Self-pay

## 2020-08-18 ENCOUNTER — Emergency Department

## 2020-08-18 ENCOUNTER — Emergency Department
Admission: EM | Admit: 2020-08-18 | Discharge: 2020-08-19 | Disposition: A | Discharge status: Home / Self Care | Attending: Student in an Organized Health Care Education/Training Program | Admitting: Student in an Organized Health Care Education/Training Program

## 2020-08-18 ENCOUNTER — Encounter: Payer: Self-pay | Admitting: *Deleted

## 2020-08-18 DIAGNOSIS — R102 Pelvic and perineal pain: Secondary | ICD-10-CM | POA: Insufficient documentation

## 2020-08-18 DIAGNOSIS — R109 Unspecified abdominal pain: Secondary | ICD-10-CM | POA: Diagnosis present

## 2020-08-18 LAB — COMPREHENSIVE METABOLIC PANEL
ALT: 14 U/L (ref 0–44)
AST: 18 U/L (ref 15–41)
Albumin: 4.1 g/dL (ref 3.5–5.0)
Alkaline Phosphatase: 43 U/L (ref 38–126)
Anion gap: 7 (ref 5–15)
BUN: 12 mg/dL (ref 6–20)
CO2: 26 mmol/L (ref 22–32)
Calcium: 8.6 mg/dL — ABNORMAL LOW (ref 8.9–10.3)
Chloride: 107 mmol/L (ref 98–111)
Creatinine, Ser: 0.73 mg/dL (ref 0.44–1.00)
GFR, Estimated: >60 mL/min (ref >60)
Glucose, Bld: 108 mg/dL — ABNORMAL HIGH (ref 70–99)
Potassium: 3.9 mmol/L (ref 3.5–5.1)
Sodium: 140 mmol/L (ref 135–145)
Total Bilirubin: 0.7 mg/dL (ref 0.3–1.2)
Total Protein: 7.2 g/dL (ref 6.5–8.1)

## 2020-08-18 LAB — CBC
HCT: 35.6 % — ABNORMAL LOW (ref 36.0–46.0)
Hemoglobin: 11.9 g/dL — ABNORMAL LOW (ref 12.0–15.0)
MCH: 30.9 pg (ref 26.0–34.0)
MCHC: 33.4 g/dL (ref 30.0–36.0)
MCV: 92.5 fL (ref 80.0–100.0)
Platelets: 247 10*3/uL (ref 150–400)
RBC: 3.85 MIL/uL — ABNORMAL LOW (ref 3.87–5.11)
RDW: 11.9 % (ref 11.5–15.5)
WBC: 7.7 10*3/uL (ref 4.0–10.5)
nRBC: 0 % (ref 0.0–0.2)

## 2020-08-18 LAB — URINALYSIS, COMPLETE (UACMP) WITH MICROSCOPIC
Bilirubin Urine: NEGATIVE
Glucose, UA: NEGATIVE mg/dL
Ketones, ur: NEGATIVE mg/dL
Leukocytes,Ua: NEGATIVE
Nitrite: NEGATIVE
Protein, ur: NEGATIVE mg/dL
Specific Gravity, Urine: 1.006 (ref 1.005–1.030)
pH: 6 (ref 5.0–8.0)

## 2020-08-18 LAB — LIPASE, BLOOD: Lipase: 28 U/L (ref 11–51)

## 2020-08-18 LAB — POC URINE PREG, ED: Preg Test, Ur: NEGATIVE

## 2020-08-18 MED ORDER — ONDANSETRON 4 MG PO TBDP: 4.0000 mg | ORAL_TABLET | Freq: Three times a day (TID) | ORAL | 0 refills | Status: DC | PRN

## 2020-08-18 MED ORDER — KETOROLAC TROMETHAMINE 10 MG PO TABS: 10.0000 mg | ORAL_TABLET | Freq: Four times a day (QID) | ORAL | 0 refills | Status: DC | PRN

## 2020-08-18 MED ORDER — MORPHINE SULFATE (PF) 4 MG/ML IV SOLN
4.0000 mg | INTRAVENOUS | Status: DC | PRN
  Administered 2020-08-18: 4 mg via INTRAVENOUS
  Filled 2020-08-18: qty 1

## 2020-08-18 MED ORDER — ONDANSETRON HCL 4 MG/2ML IJ SOLN
4.0000 mg | Freq: Once | INTRAMUSCULAR | Status: AC
Start: 2020-08-18 — End: 2020-08-18
  Administered 2020-08-18: 4 mg via INTRAVENOUS
  Filled 2020-08-18: qty 2

## 2020-08-18 MED ORDER — PROMETHAZINE HCL 25 MG/ML IJ SOLN
25.0000 mg | Freq: Once | INTRAMUSCULAR | Status: AC
  Administered 2020-08-19: 25 mg via INTRAVENOUS
  Filled 2020-08-18: qty 1

## 2020-08-18 MED ORDER — HYDROMORPHONE HCL 1 MG/ML IJ SOLN
0.5000 mg | INTRAMUSCULAR | Status: DC | PRN
Start: 2020-08-18 — End: 2020-08-19
  Administered 2020-08-18: 0.5 mg via INTRAVENOUS
  Filled 2020-08-18: qty 1

## 2020-08-18 MED ORDER — IOHEXOL 300 MG/ML  SOLN
100.0000 mL | Freq: Once | INTRAMUSCULAR | Status: AC | PRN
  Administered 2020-08-19: 100 mL via INTRAVENOUS
  Filled 2020-08-18: qty 100

## 2020-08-18 NOTE — ED Notes (Signed)
Patient transported to Ultrasound 

## 2020-08-18 NOTE — ED Triage Notes (Signed)
Pt to triage via wheelchair.  Pt has low abd pain for 1 day.  Intermittent pain. No n/v/d.  No urinary sx.  Pt alert  Speech clear.

## 2020-08-18 NOTE — ED Provider Notes (Signed)
11:00 PM  Assumed care.  Patient here with abdominal pain.  History of ovarian cyst.  Pelvic ultrasound unremarkable.  CT abdomen pelvis pending for further evaluation and disposition.  12:30 AM  Pt's CT scan shows a normal appendix.  She does appear to have bilateral adnexal cyst however these were not seen on ultrasound.  Ultrasound showed good blood flow to both ovaries.  She does have documented history of endometriosis.  Suspect that this is the cause of her pelvic pain.  Recommended close follow-up with her OB/GYN.  She reports feeling better.  Will discharge with pain and nausea medicine.  Discussed return precautions.  She denies fevers, vaginal discharge.  At this time, I do not feel there is any life-threatening condition present. I have reviewed, interpreted and discussed all results (EKG, imaging, lab, urine as appropriate) and exam findings with patient/family. I have reviewed nursing notes and appropriate previous records.  I feel the patient is safe to be discharged home without further emergent workup and can continue workup as an outpatient as needed. Discussed usual and customary return precautions. Patient/family verbalize understanding and are comfortable with this plan.  Outpatient follow-up has been provided as needed. All questions have been answered.    Einar Nolasco, Layla Maw, DO 08/19/20 [phone]

## 2020-08-18 NOTE — ED Notes (Signed)
Pt returned from [place] and requesting pain medications.

## 2020-08-18 NOTE — Discharge Instructions (Signed)
Your pelvic ultrasound today was normal.  Please follow-up with Dr. Elesa Massed for further evaluation of your symptoms.

## 2020-08-18 NOTE — ED Provider Notes (Signed)
[facility] Emergency Department Provider Note    Event Date/Time   First MD Initiated Contact with Patient 08/18/20 2003     (approximate)  I have reviewed the triage vital signs and the nursing notes.   HISTORY  Chief Complaint Abdominal Pain    HPI Erika Paul is a 25 y.o. female history of ovarian cyst requiring laparoscopic cystectomy presents to the ER for lower abdominal pain that feels similar to previous history of pain secondary to cyst.  Denies any fevers.  No nausea or vomiting.  Denies any dysuria.  Says that centralized.  No vaginal discharge but is on her menstrual cycle.    Past Medical History:  Diagnosis Date  . Abnormal Pap smear of cervix 06/2017  . PONV (postoperative nausea and vomiting)    Family History  Problem Relation Age of Onset  . Hypertension Father    Past Surgical History:  Procedure Laterality Date  . HAND SURGERY Right   . LAPAROSCOPY N/A 09/02/2017   Procedure: LAPAROSCOPY DIAGNOSTIC;  Surgeon: Ward, Elenora Fender, MD;  Location: [facility] ORS;  Service: Gynecology;  Laterality: N/A;   There are no problems to display for this patient.     Prior to Admission medications   Medication Sig Start Date End Date Taking? Authorizing Provider  ibuprofen (ADVIL,MOTRIN) 800 MG tablet Take 1 tablet (800 mg total) by mouth every 6 (six) hours. 09/02/17   Ward, Elenora Fender, MD  norgestimate-ethinyl estradiol (ORTHO-CYCLEN,SPRINTEC,PREVIFEM) 0.25-35 MG-MCG tablet Take 1 tablet by mouth daily.    [provider]    Allergies Patient has no known allergies.    Social History Social History   Tobacco Use  . Smoking status: Never Smoker  . Smokeless tobacco: Never Used  Substance Use Topics  . Alcohol use: No  . Drug use: No    Review of Systems Patient denies headaches, rhinorrhea, blurry vision, numbness, shortness of breath, chest pain, edema, cough, abdominal pain, nausea, vomiting, diarrhea, dysuria,  fevers, rashes or hallucinations unless otherwise stated above in HPI. ____________________________________________   PHYSICAL EXAM:  VITAL SIGNS: Vitals:   08/18/20 2125 08/18/20 2125  BP:  121/83  Pulse: 63 63  Resp:  18  Temp:    SpO2: 99% 99%    Constitutional: Alert and oriented.  Eyes: Conjunctivae are normal.  Head: Atraumatic. Nose: No congestion/rhinnorhea. Mouth/Throat: Mucous membranes are moist.   Neck: No stridor. Painless ROM.  Cardiovascular: Normal rate, regular rhythm. Grossly normal heart sounds.  Good peripheral circulation. Respiratory: Normal respiratory effort.  No retractions. Lungs CTAB. Gastrointestinal: Soft and nontender. No distention. No abdominal bruits. No CVA tenderness. Genitourinary: deferred Musculoskeletal: No lower extremity tenderness nor edema.  No joint effusions. Neurologic:  Normal speech and language. No gross focal neurologic deficits are appreciated. No facial droop Skin:  Skin is warm, dry and intact. No rash noted. Psychiatric: Mood and affect are normal. Speech and behavior are normal.  ____________________________________________   LABS (all labs ordered are listed, but only abnormal results are displayed)  Results for orders placed or performed during the hospital encounter of 08/18/20 (from the past 24 hour(s))  Lipase, blood     Status: None   Collection Time: 08/18/20  6:07 PM  Result Value Ref Range   Lipase 28 11 - 51 U/L  Comprehensive metabolic panel     Status: Abnormal   Collection Time: 08/18/20  6:07 PM  Result Value Ref Range   Sodium 140 135 - 145 mmol/L   Potassium  3.9 3.5 - 5.1 mmol/L   Chloride 107 98 - 111 mmol/L   CO2 26 22 - 32 mmol/L   Glucose, Bld 108 (H) 70 - 99 mg/dL   BUN 12 6 - 20 mg/dL   Creatinine, Ser 0.73 0.44 - 1.00 mg/dL   Calcium 8.6 (L) 8.9 - 10.3 mg/dL   Total Protein 7.2 6.5 - 8.1 g/dL   Albumin 4.1 3.5 - 5.0 g/dL   AST 18 15 - 41 U/L   ALT 14 0 - 44 U/L   Alkaline Phosphatase  43 38 - 126 U/L   Total Bilirubin 0.7 0.3 - 1.2 mg/dL   GFR, Estimated >60 >60 mL/min   Anion gap 7 5 - 15  CBC     Status: Abnormal   Collection Time: 08/18/20  6:07 PM  Result Value Ref Range   WBC 7.7 4.0 - 10.5 K/uL   RBC 3.85 (L) 3.87 - 5.11 MIL/uL   Hemoglobin 11.9 (L) 12.0 - 15.0 g/dL   HCT 35.6 (L) 36.0 - 46.0 %   MCV 92.5 80.0 - 100.0 fL   MCH 30.9 26.0 - 34.0 pg   MCHC 33.4 30.0 - 36.0 g/dL   RDW 11.9 11.5 - 15.5 %   Platelets 247 150 - 400 K/uL   nRBC 0.0 0.0 - 0.2 %  POC Urine Pregnancy, ED     Status: None   Collection Time: 08/18/20  9:05 PM  Result Value Ref Range   Preg Test, Ur NEGATIVE NEGATIVE  Urinalysis, Complete w Microscopic Urine, Clean Catch     Status: Abnormal   Collection Time: 08/18/20  9:20 PM  Result Value Ref Range   Color, Urine STRAW (A) YELLOW   APPearance CLEAR (A) CLEAR   Specific Gravity, Urine 1.006 1.005 - 1.030   pH 6.0 5.0 - 8.0   Glucose, UA NEGATIVE NEGATIVE mg/dL   Hgb urine dipstick LARGE (A) NEGATIVE   Bilirubin Urine NEGATIVE NEGATIVE   Ketones, ur NEGATIVE NEGATIVE mg/dL   Protein, ur NEGATIVE NEGATIVE mg/dL   Nitrite NEGATIVE NEGATIVE   Leukocytes,Ua NEGATIVE NEGATIVE   RBC / HPF 0-5 0 - 5 RBC/hpf   WBC, UA 0-5 0 - 5 WBC/hpf   Bacteria, UA RARE (A) NONE SEEN   Squamous Epithelial / LPF 0-5 0 - 5   Mucus PRESENT    ____________________________________________ ____________________________________________  RADIOLOGY  I personally reviewed all radiographic images ordered to evaluate for the above acute complaints and reviewed radiology reports and findings.  These findings were personally discussed with the patient.  Please see medical record for radiology report.  ____________________________________________   PROCEDURES  Procedure(s) performed:  Procedures    Critical Care performed: no ____________________________________________   INITIAL IMPRESSION / ASSESSMENT AND PLAN / ED COURSE  Pertinent labs &  imaging results that were available during my care of the patient were reviewed by me and considered in my medical decision making (see chart for details).   DDX: Ovarian cyst, torsion, abscess, appendicitis, cystitis  Erika Paul is a 25 y.o. who presents to the ED with presentation as described above.  Patient describes symptoms being very similar to history of previous ovarian cyst for which she required surgery.  She is afebrile hemodynamically stable.  Have a lower suspicion for infectious process or appendicitis.  Transvaginal ultrasound ordered.  Patient be signed out to oncoming physician pending reassessment and results of ultrasound     The patient was evaluated in Emergency Department today for the symptoms described  in the history of present illness. He/she was evaluated in the context of the global COVID-19 pandemic, which necessitated consideration that the patient might be at risk for infection with the SARS-CoV-2 virus that causes COVID-19. Institutional protocols and algorithms that pertain to the evaluation of patients at risk for COVID-19 are in a state of rapid change based on information released by regulatory bodies including the CDC and federal and state organizations. These policies and algorithms were followed during the patient's care in the ED.  As part of my medical decision making, I reviewed the following data within the electronic MEDICAL RECORD NUMBER Nursing notes reviewed and incorporated, Labs reviewed, notes from prior ED visits and  Controlled Substance Database   ____________________________________________   FINAL CLINICAL IMPRESSION(S) / ED DIAGNOSES  Final diagnoses:  Pelvic pain      NEW MEDICATIONS STARTED DURING THIS VISIT:  New Prescriptions   No medications on file     Note:  This document was prepared using Dragon voice recognition software and may include unintentional dictation errors.    Willy Eddy, MD 08/18/20 2150

## 2020-08-19 MED ORDER — OXYCODONE-ACETAMINOPHEN 5-325 MG PO TABS: 2.0000 | ORAL_TABLET | Freq: Four times a day (QID) | ORAL | 0 refills | Status: AC | PRN

## 2020-08-19 MED ORDER — ONDANSETRON 4 MG PO TBDP: 4.0000 mg | ORAL_TABLET | Freq: Three times a day (TID) | ORAL | 0 refills | Status: AC | PRN

## 2020-08-19 MED ORDER — KETOROLAC TROMETHAMINE 10 MG PO TABS: 10.0000 mg | ORAL_TABLET | Freq: Four times a day (QID) | ORAL | 0 refills | Status: AC | PRN

## 2020-08-19 NOTE — ED Notes (Signed)
Patient transported to CT 

## 2022-06-17 IMAGING — CT CT ABD-PELV W/ CM
2 of 4 series · 16 of 46 positions shown, 18 images · IV contrast (omnipaque)
Comparison: September 02, 2017

CLINICAL DATA: Lower abdominal pain x1 day.

EXAM:
CT ABDOMEN AND PELVIS WITH CONTRAST
TECHNIQUE: Multidetector CT imaging of the abdomen and pelvis was performed
using the standard protocol following bolus administration of
intravenous contrast.
CONTRAST:  100mL OMNIPAQUE IOHEXOL 300 MG/ML  SOLN

[Series 2: routine abd/pel with · axial · 0.80mm/px · z∈[-529,-114]mm · 13 of 91 slices shown, 15 images]
[im 4/91  soft-tissue]
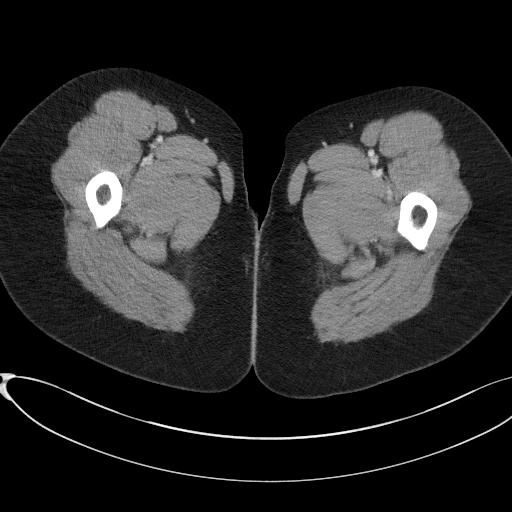
[im 4/91  bone]
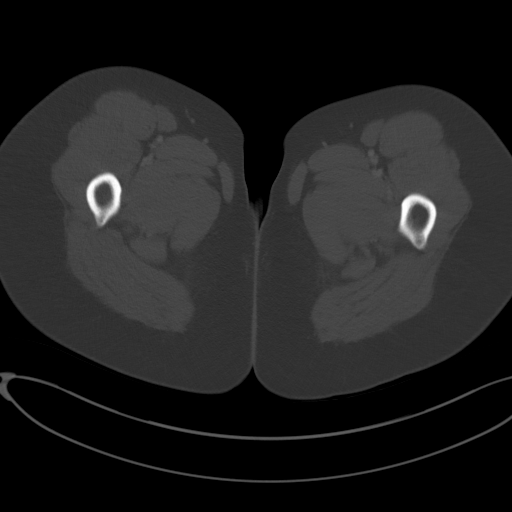
[im 11/91  soft-tissue]
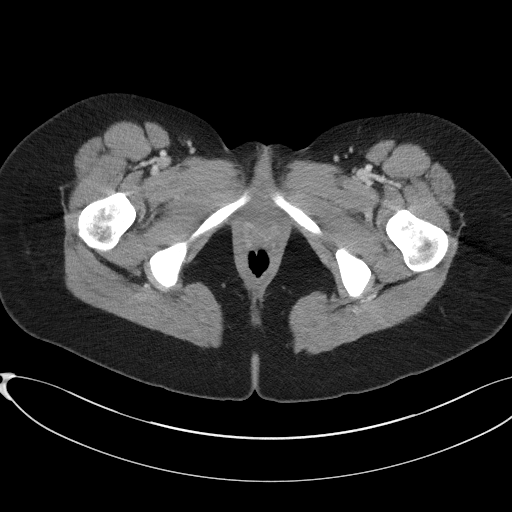
[im 19/91  soft-tissue]
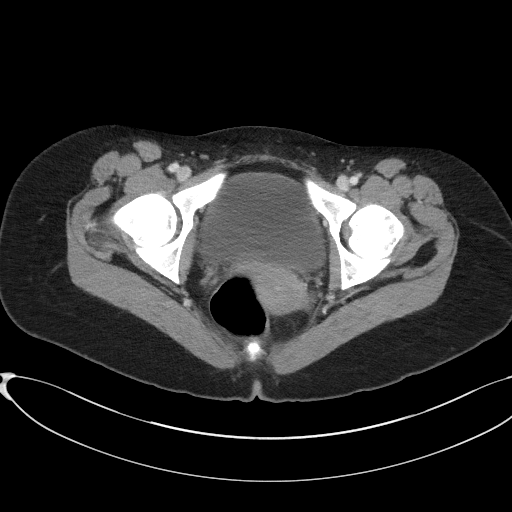
[im 26/91  soft-tissue]
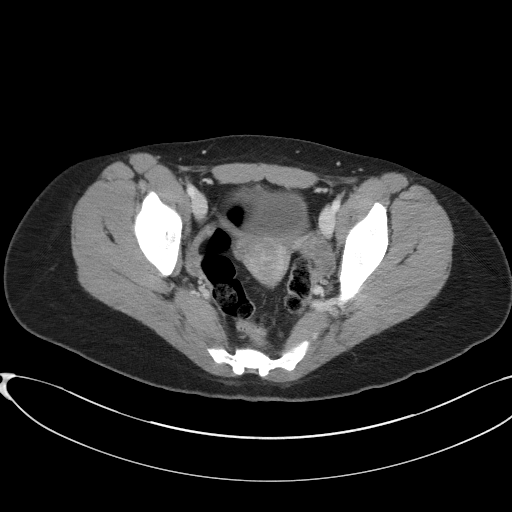
[im 33/91  soft-tissue]
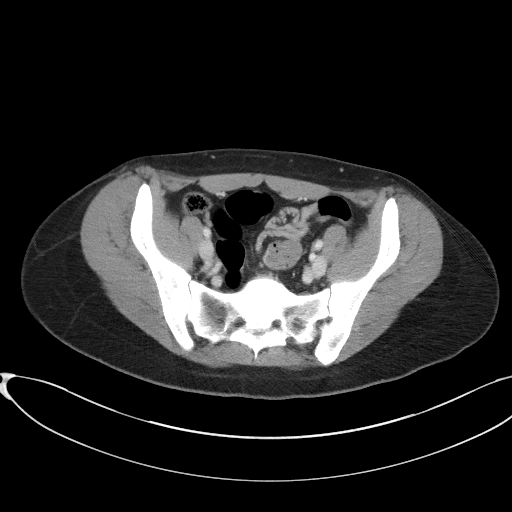
[im 40/91  soft-tissue]
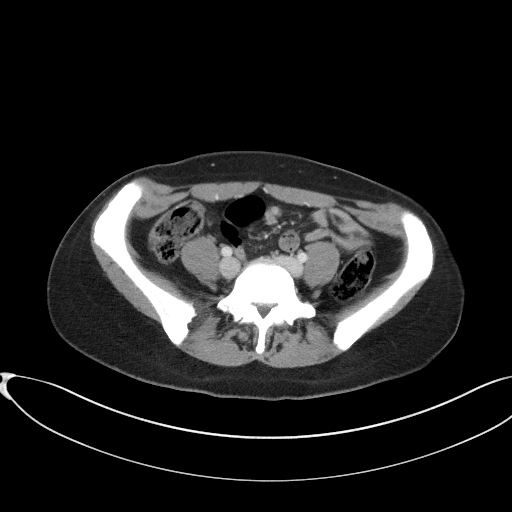
[im 47/91  soft-tissue]
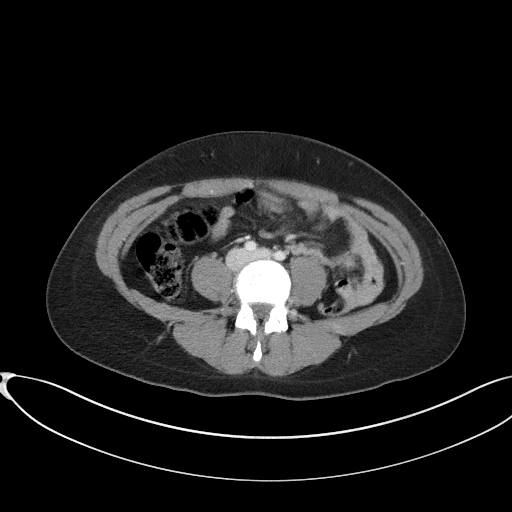
[im 51/91  soft-tissue]
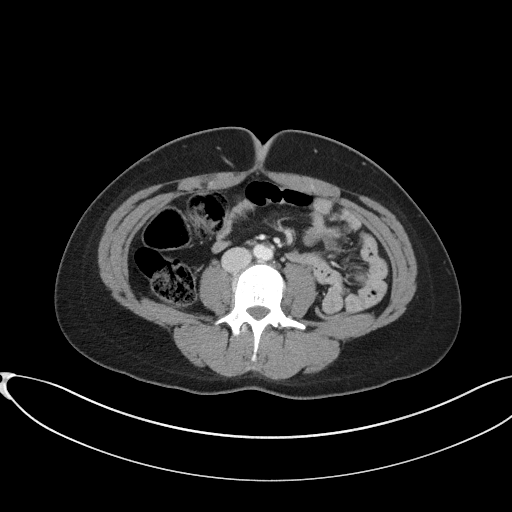
[im 58/91  soft-tissue]
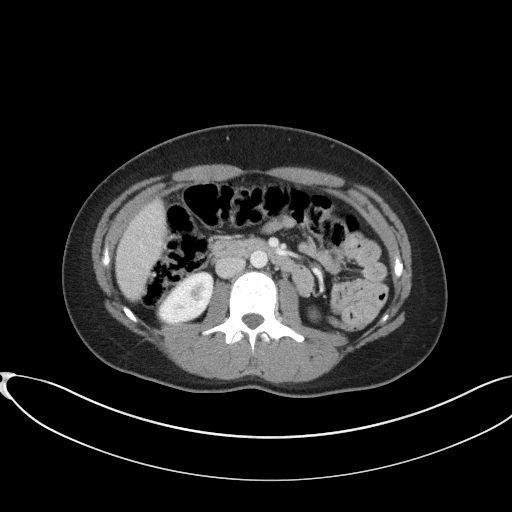
[im 58/91  bone]
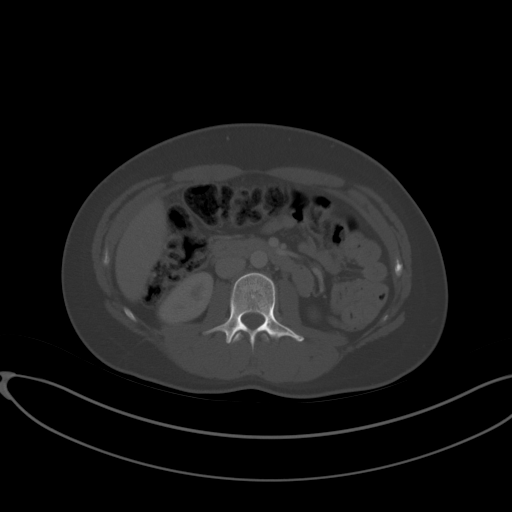
[im 65/91  soft-tissue]
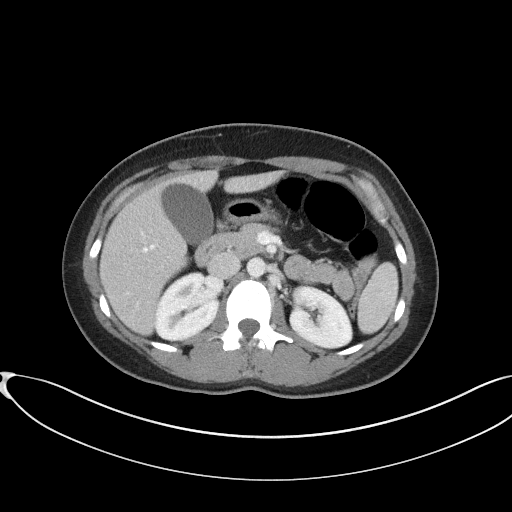
[im 73/91  soft-tissue]
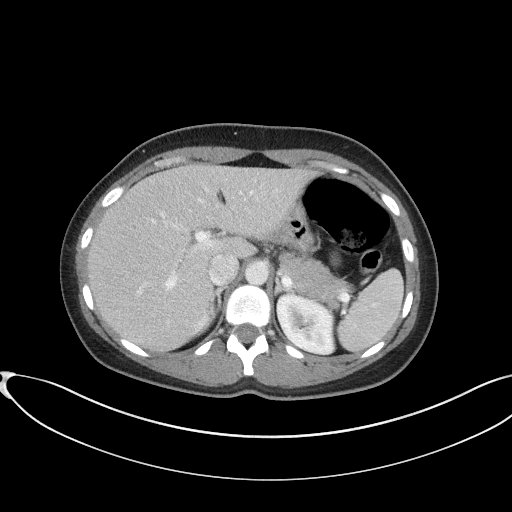
[im 80/91  soft-tissue]
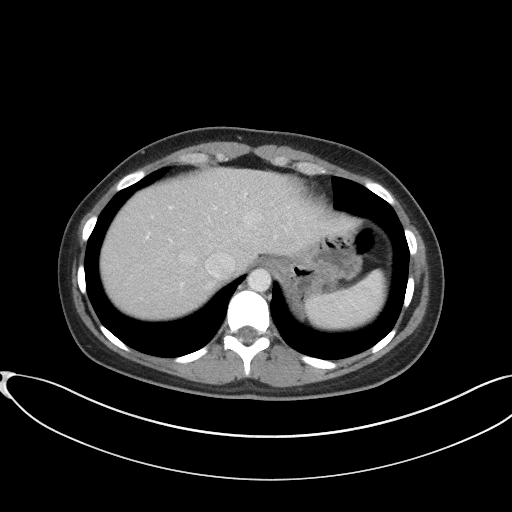
[im 87/91  soft-tissue]
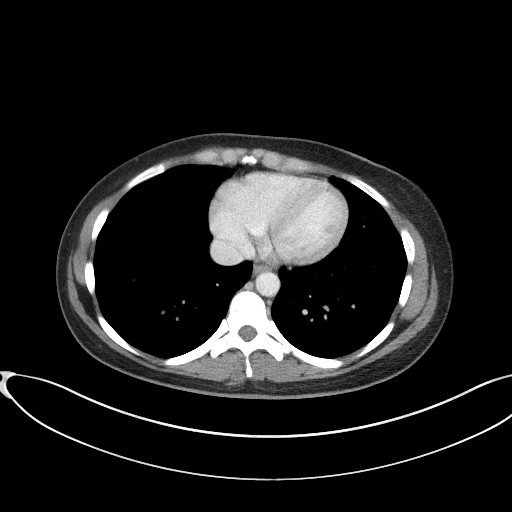

[Series 5: coronal st · coronal · 0.74mm/px · 3 of 74 slices shown]
[im 25/74  soft-tissue]
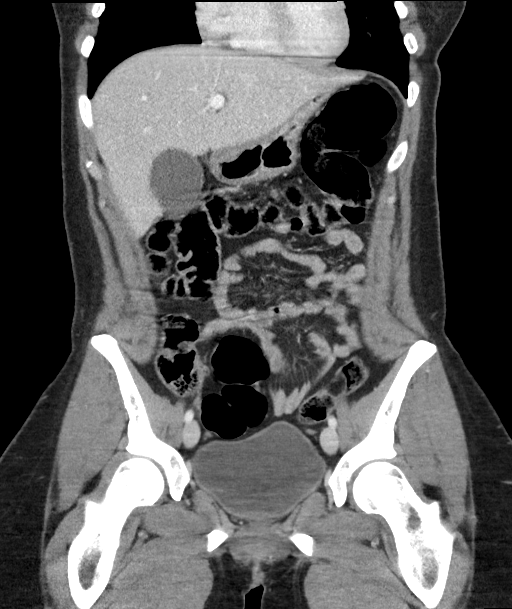
[im 33/74  soft-tissue]
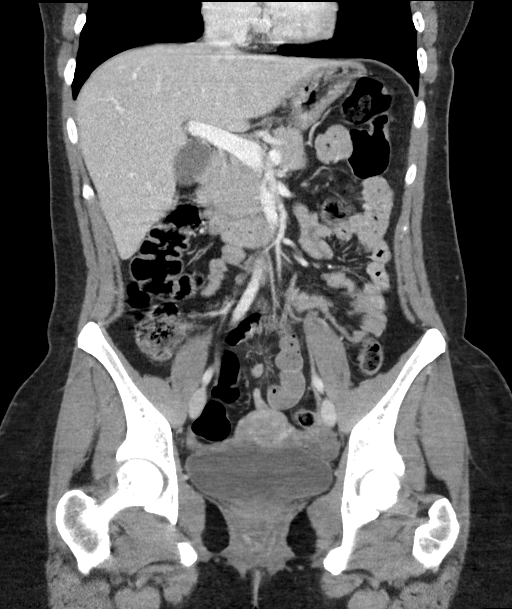
[im 41/74  soft-tissue]
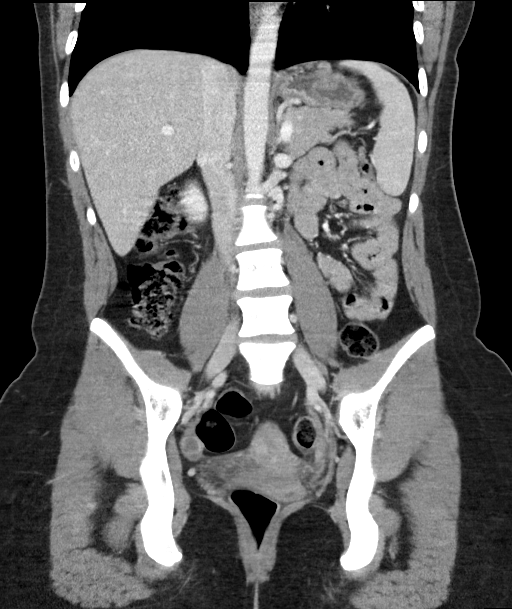

[16 of 46 positions shown; findings below may reference images not displayed]

FINDINGS: Lower chest: No acute abnormality.

Hepatobiliary: No focal liver abnormality is seen. No gallstones,
gallbladder wall thickening, or biliary dilatation.

Pancreas: Unremarkable. No pancreatic ductal dilatation or
surrounding inflammatory changes.

Spleen: Normal in size without focal abnormality.

Adrenals/Urinary Tract: Adrenal glands are unremarkable. Kidneys are
normal, without renal calculi, focal lesion, or hydronephrosis.
Bladder is unremarkable.

Stomach/Bowel: Stomach is within normal limits. Appendix appears
normal. No evidence of bowel wall thickening, distention, or
inflammatory changes.

Vascular/Lymphatic: No significant vascular findings are present. No
enlarged abdominal or pelvic lymph nodes.

Reproductive: The uterus is normal in size and heterogeneous in
appearance. A 1.2 cm x 0.9 cm cyst is seen within the posterior
aspect of the right adnexa. A similar appearing 1.1 cm diameter cyst
is seen within the posterior aspect of the left adnexa.

Other: No abdominal wall hernia or abnormality. No abdominopelvic
ascites.

Musculoskeletal: No acute or significant osseous findings.
IMPRESSION: Small bilateral adnexal cysts, likely ovarian in origin.
